# Patient Record
Sex: Female | Born: 1950 | Hispanic: No | Marital: Single | Smoking: Former smoker
Health system: Southern US, Community
[De-identification: ages and names within clinical notes are randomized; demographics above are authoritative.]

## PROBLEM LIST (undated history)

## (undated) DIAGNOSIS — J449 Chronic obstructive pulmonary disease, unspecified: Secondary | ICD-10-CM

## (undated) DIAGNOSIS — S14109A Unspecified injury at unspecified level of cervical spinal cord, initial encounter: Secondary | ICD-10-CM

## (undated) DIAGNOSIS — S069X9A Unspecified intracranial injury with loss of consciousness of unspecified duration, initial encounter: Secondary | ICD-10-CM

## (undated) DIAGNOSIS — S069XAA Unspecified intracranial injury with loss of consciousness status unknown, initial encounter: Secondary | ICD-10-CM

## (undated) DIAGNOSIS — M5126 Other intervertebral disc displacement, lumbar region: Secondary | ICD-10-CM

## (undated) HISTORY — PX: ABDOMINAL EXPLORATION SURGERY: SHX538

## (undated) HISTORY — PX: NECK SURGERY: SHX720

---

## 2015-01-18 ENCOUNTER — Emergency Department (HOSPITAL_COMMUNITY)
Admission: EM | Admit: 2015-01-18 | Discharge: 2015-01-18 | Disposition: A | Discharge status: Home / Self Care | Payer: Medicare PPO | Attending: Emergency Medicine | Admitting: Emergency Medicine

## 2015-01-18 ENCOUNTER — Emergency Department (HOSPITAL_COMMUNITY): Payer: Medicare PPO

## 2015-01-18 ENCOUNTER — Encounter (HOSPITAL_COMMUNITY): Payer: Self-pay

## 2015-01-18 DIAGNOSIS — J449 Chronic obstructive pulmonary disease, unspecified: Secondary | ICD-10-CM | POA: Insufficient documentation

## 2015-01-18 DIAGNOSIS — Z79899 Other long term (current) drug therapy: Secondary | ICD-10-CM | POA: Insufficient documentation

## 2015-01-18 DIAGNOSIS — M5136 Other intervertebral disc degeneration, lumbar region: Secondary | ICD-10-CM | POA: Diagnosis not present

## 2015-01-18 DIAGNOSIS — Z87891 Personal history of nicotine dependence: Secondary | ICD-10-CM | POA: Diagnosis not present

## 2015-01-18 DIAGNOSIS — K458 Other specified abdominal hernia without obstruction or gangrene: Secondary | ICD-10-CM | POA: Diagnosis not present

## 2015-01-18 DIAGNOSIS — M79605 Pain in left leg: Secondary | ICD-10-CM

## 2015-01-18 DIAGNOSIS — M5126 Other intervertebral disc displacement, lumbar region: Secondary | ICD-10-CM

## 2015-01-18 DIAGNOSIS — M545 Low back pain: Secondary | ICD-10-CM

## 2015-01-18 DIAGNOSIS — Z87828 Personal history of other (healed) physical injury and trauma: Secondary | ICD-10-CM | POA: Diagnosis not present

## 2015-01-18 DIAGNOSIS — M549 Dorsalgia, unspecified: Secondary | ICD-10-CM | POA: Diagnosis not present

## 2015-01-18 HISTORY — DX: Unspecified intracranial injury with loss of consciousness of unspecified duration, initial encounter: S06.9X9A

## 2015-01-18 HISTORY — DX: Unspecified intracranial injury with loss of consciousness status unknown, initial encounter: S06.9XAA

## 2015-01-18 HISTORY — DX: Unspecified injury at unspecified level of cervical spinal cord, initial encounter: S14.109A

## 2015-01-18 HISTORY — DX: Chronic obstructive pulmonary disease, unspecified: J44.9

## 2015-01-18 MED ORDER — TIZANIDINE HCL 4 MG PO TABS: 8.0000 mg | ORAL_TABLET | Freq: Three times a day (TID) | ORAL | Status: DC

## 2015-01-18 MED ORDER — PREGABALIN 200 MG PO CAPS: 200.0000 mg | ORAL_CAPSULE | Freq: Two times a day (BID) | ORAL | Status: DC

## 2015-01-18 MED ORDER — OXYCODONE-ACETAMINOPHEN 5-325 MG PO TABS: 1.0000 | ORAL_TABLET | Freq: Four times a day (QID) | ORAL | Status: DC | PRN

## 2015-01-18 MED ORDER — OXYCODONE-ACETAMINOPHEN 5-325 MG PO TABS
2.0000 | ORAL_TABLET | Freq: Once | ORAL | Status: AC
  Administered 2015-01-18: 2 via ORAL
  Filled 2015-01-18: qty 2

## 2015-01-18 MED ORDER — HYDROMORPHONE HCL 1 MG/ML IJ SOLN
1.0000 mg | Freq: Once | INTRAMUSCULAR | Status: AC
  Administered 2015-01-18: 1 mg via INTRAMUSCULAR
  Filled 2015-01-18: qty 1

## 2015-01-18 MED ORDER — DULOXETINE HCL 60 MG PO CPEP: 60.0000 mg | ORAL_CAPSULE | Freq: Every day | ORAL | Status: DC

## 2015-01-18 MED ORDER — TIZANIDINE HCL 4 MG PO TABS: 4.0000 mg | ORAL_TABLET | Freq: Four times a day (QID) | ORAL | Status: AC | PRN

## 2015-01-18 NOTE — ED Notes (Signed)
Patient transported to X-ray 

## 2015-01-18 NOTE — Progress Notes (Addendum)
1541 CM called Dr Zachery Dauer office [phone] and spoke with staff to find out pt appt in December was not with Dr Zachery Dauer but with Dr Barbaraann Barthel.  Office staff able to provide Cm with an earlier appt for 02/11/15 at 11 am with Sigmund Hazel, MD Pt agreed to this appt and will updated [employer] on lisa miller name so it will be in [place] data base prior to her appt.  This appt confirmed with [facility] office staff This appt place in pt d/c instructions and given to pt with a list of in network [place] MDs and urgent care centers in [place] [id number] Pt very appreciative of services rendered  EDP updated RN updated  1446 EDP Cook updated 1443 CM finished speaking with pt who confirms she has [employer] Lawless PPO plan and is scheduled to see Dr Juluis Rainier (because her female friend at the bedside goes to Dr Zachery Dauer) to establish care in December 2016 but is running out of medications  CM recommended pt contact a [employer] dr from a list of [facility] in network doctors to see prior to her December appt with Dr Zachery Dauer or one of the local urgent cares CM explained to the pt the differences in pcp vs EDP services Discussed EDPs get patients out of emergency situations and provide emergency care until the pcp can follow up with pt standard care Pt voiced understanding and states she has memory issues ("this is why I brought Miranda" ) 1352 ED CM spoke with EDP Adriana Simas about pt concerns Reports pt recently moved to [facility] from [facility] and earliest MD appt will be in December 2016 Pt about to run out of her medications

## 2015-01-18 NOTE — ED Notes (Signed)
Pt here with back pain and body aches.  Pt had spinal surgery in ?2000.  Pt had spinal injury during surgery.  Pt also has hx of TBI.  Pt here bc she fell x 3 weeks ago.  Has had similar pain to back and leg as she did before.  Bruising to hip.  Takes home pain meds chronically and her meds are not helping her pain.  Just moved here and cannot get into a new MD until Dec. 6th.

## 2015-01-18 NOTE — Discharge Instructions (Signed)
I have given you prescriptions for 2 weeks. Follow-up with primary care as discussed by Child psychotherapist.   Herniated Disk A herniated disk occurs when a disk in your spine bulges out too far. This condition is also called a ruptured disk or slipped disk. Your spine (backbone) is made up of bones called vertebrae. Between each pair of vertebrae is an oval disk with a soft, spongy center that acts as a shock absorber when you move. The spongy center is surrounded by a tough outer ring. When you have a herniated disk, the spongy center of the disk bulges out or ruptures through the outer ring. A herniated disk can press on a nerve between your vertebrae and cause pain. A herniated disk can occur anywhere in your back or neck area, but the lower back is the most common spot. CAUSES  In many cases, a herniated disk occurs just from getting older. As you age, the spongy insides of your disks tend to shrink and dry out. A herniated disk can result from gradual wear and tear. Injury or sudden strain can also cause a herniated disk.  RISK FACTORS Aging is the main risk factor for a herniated disk. Other risk factors include:  Being a man between the ages of 73 and 56 years.  Having a job that requires heavy lifting, bending, or twisting.  Having a job that requires long hours of driving.  Not getting enough exercise.  Being overweight.  Smoking. SIGNS AND SYMPTOMS  Signs and symptoms depend on which disk is herniated.  For a herniated disk in the lower back, you may have sharp pain in:  One part of your leg, hip, or buttocks.  The back of your calf.  The top or sole of your foot (sciatica).   For a herniated disk in the neck, you may feel pain:  When you move your neck.  Near or over your shoulder blade.  That moves to your upper arm, forearm, or fingers.   You may also have muscle weakness. It may be hard to:  Lift your leg or arm.  Stand on your toes.  Squeeze tightly with one  of your hands.  Other symptoms can include:  Numbness or tingling in the affected areas of your body.  Loss of bladder or bowel control. This is a rare but serious sign of a severe herniated disk in the lower back. DIAGNOSIS  Your health care provider will do a physical exam. During this exam, you may have to move certain body parts or assume various positions. For example, your health care provider may do the straight-leg test. This is a good way to test for a herniated disk in your lower back. In this test, the health care provider lifts your leg while you lie on your back. This is to see if you feel pain down your leg. Your health care provider will also check for numbness or loss of feeling.  Your health care provider will also check your:  Reflexes.  Muscle strength.  Posture.  Other tests may be done to help in making a diagnosis. These may include:  An X-ray of the spine to rule out other causes of back pain.   Other imaging studies, such as an MRI or CT scan. This is to check whether the herniated disk is pressing on your spinal canal.  Electromyography (EMG). This test checks the nerves that control muscles. It is sometimes used to identify the specific area of nerve involvement.  TREATMENT  In many cases, herniated disk symptoms go away over a period of days or weeks. You will most likely be free of symptoms in 3-4 months. Treatment may include the following:  The initial treatment for a herniated disk is ashort period of rest.  Bed rest is often limited to 1 or 2 days. Resting for too long delays recovery.  If you have a herniated disk in your lower back, you should avoid sitting as much as possible because sitting increases pressure on the disk.  Medicines. These may include:   Nonsteroidal anti-inflammatory drugs (NSAIDs).  Muscle relaxants for back spasms.  Narcotic pain medicine if your pain is very bad.   Steroid injections. You may need these along the  involved nerve root to help control pain. The steroid is injected in the area of the herniated disk. It helps by reducing swelling around the disk.  Physical therapy. This may include exercises to strengthen the muscles that help support your spine.   You may need surgery if other treatments do not work.  HOME CARE INSTRUCTIONS Follow all your health care provider's instructions. These may include:  Take all medicines as directed by your health care provider.  Rest for 2 days and then start moving.  Do not sit or stand for long periods of time.  Maintain good posture when sitting and standing.  Avoid movements that cause pain, such as bending or lifting.  When you are able to start lifting things again:  [place] with your knees.  Keep your back straight.  Hold heavy objects close to your body.  If you are overweight, ask your health care provider to help you start a weight-loss program.  When you are able to start exercising, ask your health care provider how much and what type of exercise is best for you.  Work with a physical therapist on stretching and strengthening exercises for your back.  Do not wear high-heeled shoes.  Do not sleep on your belly.  Do not smoke.  Keep all follow-up visits as directed by your health care provider. SEEK MEDICAL CARE IF:  You have back or neck pain that is not getting better after 4 weeks.  You have very bad pain in your back or neck.  You develop numbness, tingling, or weakness along with pain. SEEK IMMEDIATE MEDICAL CARE IF:   You have numbness, tingling, or weakness that makes you unable to use your arms or legs.  You lose control of your bladder or bowels.  You have dizziness or fainting.  You have shortness of breath.  MAKE SURE YOU:   Understand these instructions.  Will watch your condition.  Will get help right away if you are not doing well or get worse. Document Released: 04/21/2000 Document Revised:  09/08/2013 Document Reviewed: 03/28/2013 [facility] Patient Information 2015 [place], [place]. This information is not intended to replace advice given to you by your health care provider. Make sure you discuss any questions you have with your health care provider.

## 2015-01-18 NOTE — ED Notes (Signed)
MD at bedside. 

## 2015-01-18 NOTE — ED Provider Notes (Addendum)
CSN: [id number]     Arrival date & time 01/18/15  1121 History   First MD Initiated Contact with Patient 01/18/15 1146     Chief Complaint  Patient presents with  . Back Pain  . Generalized Body Aches     (Consider location/radiation/quality/duration/timing/severity/associated sxs/prior Treatment) HPI.....Marland Kitchen low back pain with radiation to left leg for 2-3 months. Patient recently moved here from [place] does not have a primary care appointment until December. She is status post an anterior fusion of C4,5,6 in [place] several years ago. Additionally she has run out of her medication which includes Tizanidine, Lyrica, Duloxetine.  No bowel or bladder incontinence. No fever, sweats, chills.  Past Medical History  Diagnosis Date  . TBI (traumatic brain injury)   . Spinal cord injury, cervical region   . COPD (chronic obstructive pulmonary disease)    Past Surgical History  Procedure Laterality Date  . Neck surgery    . Abdominal exploration surgery     No family history on file. Social History  Substance Use Topics  . Smoking status: Former Games developer  . Smokeless tobacco: Not on file  . Alcohol Use: No   OB History    No data available     Review of Systems  All other systems reviewed and are negative.     Allergies  Sulfa antibiotics and Other  Home Medications   Prior to Admission medications   Medication Sig Start Date End Date Taking? Authorizing Provider  acetaminophen (TYLENOL) 500 MG tablet Take 2,000 mg by mouth 2 (two) times daily.   Yes Historical Provider, MD  albuterol (PROVENTIL HFA;VENTOLIN HFA) 108 (90 BASE) MCG/ACT inhaler Inhale 1-2 puffs into the lungs every 6 (six) hours as needed for wheezing or shortness of breath.   Yes Historical Provider, MD  Aluminum Acetate (ACID MANTLE EX) Apply 1 application topically 6 (six) times daily. Adds hydrocortisone to it.   Yes Historical Provider, MD  Cholecalciferol (VITAMIN D PO) Take 1 tablet by mouth daily.   Yes  Historical Provider, MD  cyclobenzaprine (FLEXERIL) 10 MG tablet Take 20 mg by mouth 3 (three) times daily as needed for muscle spasms.   Yes Historical Provider, MD  diphenhydramine-acetaminophen (TYLENOL PM) 25-500 MG TABS Take 3-4 tablets by mouth at bedtime as needed (pain).   Yes Historical Provider, MD  DULoxetine (CYMBALTA) 60 MG capsule Take 120 mg by mouth daily.   Yes Historical Provider, MD  guaiFENesin (MUCINEX) 600 MG 12 hr tablet Take 1 mg by mouth 2 (two) times daily.   Yes Historical Provider, MD  lanolin ointment Apply 1 application topically 2 (two) times daily.   Yes Historical Provider, MD  Multiple Vitamin (MULTIVITAMIN WITH MINERALS) TABS tablet Take 1 tablet by mouth daily.   Yes Historical Provider, MD  naproxen sodium (ANAPROX) 220 MG tablet Take 220 mg by mouth 2 (two) times daily.   Yes Historical Provider, MD  POTASSIUM PO Take 1 tablet by mouth daily.   Yes Historical Provider, MD  pregabalin (LYRICA) 200 MG capsule Take 200-400 mg by mouth 2 (two) times daily. Takes one capsule in the morning  ( 200 mg) and two capsules at night (400 mg)   Yes Historical Provider, MD  Pyridoxine HCl (VITAMIN B-6 PO) Take 1 tablet by mouth daily.   Yes Historical Provider, MD  tiZANidine (ZANAFLEX) 4 MG tablet Take 8 mg by mouth every 6 (six) hours as needed for muscle spasms.   Yes Historical Provider, MD   BP 137/79  mmHg  Pulse 97  Temp(Src) 98.2 F (36.8 C) (Oral)  Resp 16  SpO2 97% Physical Exam  Constitutional: She is oriented to person, place, and time. She appears well-developed and well-nourished.  HENT:  Head: Normocephalic and atraumatic.  Eyes: Conjunctivae and EOM are normal. Pupils are equal, round, and reactive to light.  Neck: Normal range of motion. Neck supple.  Cardiovascular: Normal rate and regular rhythm.   Pulmonary/Chest: Effort normal and breath sounds normal.  Abdominal: Soft. Bowel sounds are normal.  Musculoskeletal:  Minimal tenderness lower spine   Neurological: She is alert and oriented to person, place, and time.  Skin: Skin is warm and dry.  Psychiatric: She has a normal mood and affect. Her behavior is normal.  Nursing note and vitals reviewed.   ED Course  Procedures (including critical care time) Labs Review Labs Reviewed - No data to display  Imaging Review Dg Lumbar Spine Complete  01/18/2015   CLINICAL DATA:  Status post fall 2 months ago with low back pain.  EXAM: LUMBAR SPINE - COMPLETE 4+ VIEW  COMPARISON:  None.  FINDINGS: There is no evidence of lumbar spine fracture. There is scoliosis of spine. There mild decreased intervertebral space throughout lumbar spine. Facet joint sclerosis is identified throughout lumbar spine.  IMPRESSION: No acute fracture or dislocation. Degenerative joint changes throughout lumbar spine.   Electronically Signed   By: Sherian Rein M.D.   On: 01/18/2015 13:48   I have personally reviewed and evaluated these images and lab results as part of my medical decision-making.   EKG Interpretation None      MDM   Final diagnoses:  Low back pain radiating to left leg    MRI of lumbar spine pending. Social work consult obtained. Will prescribe her medication for 2 weeks. Discussed with Dr. Ilean China, MD 01/18/15 1541  Donnetta Hutching, MD 01/18/15 1623

## 2015-01-25 ENCOUNTER — Emergency Department (HOSPITAL_COMMUNITY)
Admission: EM | Admit: 2015-01-25 | Discharge: 2015-01-25 | Disposition: A | Discharge status: Home / Self Care | Payer: Medicare PPO | Attending: Emergency Medicine | Admitting: Emergency Medicine

## 2015-01-25 ENCOUNTER — Encounter (HOSPITAL_COMMUNITY): Payer: Self-pay | Admitting: *Deleted

## 2015-01-25 DIAGNOSIS — J449 Chronic obstructive pulmonary disease, unspecified: Secondary | ICD-10-CM | POA: Insufficient documentation

## 2015-01-25 DIAGNOSIS — M5442 Lumbago with sciatica, left side: Secondary | ICD-10-CM | POA: Insufficient documentation

## 2015-01-25 DIAGNOSIS — M545 Low back pain: Secondary | ICD-10-CM | POA: Diagnosis present

## 2015-01-25 DIAGNOSIS — Z87820 Personal history of traumatic brain injury: Secondary | ICD-10-CM | POA: Insufficient documentation

## 2015-01-25 DIAGNOSIS — Z79899 Other long term (current) drug therapy: Secondary | ICD-10-CM | POA: Diagnosis not present

## 2015-01-25 DIAGNOSIS — R200 Anesthesia of skin: Secondary | ICD-10-CM | POA: Insufficient documentation

## 2015-01-25 DIAGNOSIS — Z791 Long term (current) use of non-steroidal anti-inflammatories (NSAID): Secondary | ICD-10-CM | POA: Diagnosis not present

## 2015-01-25 DIAGNOSIS — Z87891 Personal history of nicotine dependence: Secondary | ICD-10-CM | POA: Diagnosis not present

## 2015-01-25 HISTORY — DX: Other intervertebral disc displacement, lumbar region: M51.26

## 2015-01-25 MED ORDER — TRAMADOL HCL 50 MG PO TABS: 50.0000 mg | ORAL_TABLET | Freq: Four times a day (QID) | ORAL | Status: DC | PRN

## 2015-01-25 MED ORDER — NAPROXEN 250 MG PO TABS: 250.0000 mg | ORAL_TABLET | Freq: Two times a day (BID) | ORAL | Status: DC

## 2015-01-25 NOTE — Discharge Instructions (Signed)
Back Exercises °Back exercises help treat and prevent back injuries. The goal of back exercises is to increase the strength of your abdominal and back muscles and the flexibility of your back. These exercises should be started when you no longer have back pain. Back exercises include: °· Pelvic Tilt. Lie on your back with your knees bent. Tilt your pelvis until the lower part of your back is against the floor. Hold this position 5 to 10 sec and repeat 5 to 10 times. °· Knee to Chest. Pull first 1 knee up against your chest and hold for 20 to 30 seconds, repeat this with the other knee, and then both knees. This may be done with the other leg straight or bent, whichever feels better. °· Sit-Ups or Curl-Ups. Bend your knees 90 degrees. Start with tilting your pelvis, and do a partial, slow sit-up, lifting your trunk only 30 to 45 degrees off the floor. Take at least 2 to 3 seconds for each sit-up. Do not do sit-ups with your knees out straight. If partial sit-ups are difficult, simply do the above but with only tightening your abdominal muscles and holding it as directed. °· Hip-Lift. Lie on your back with your knees flexed 90 degrees. Push down with your feet and shoulders as you raise your hips a couple inches off the floor; hold for 10 seconds, repeat 5 to 10 times. °· Back arches. Lie on your stomach, propping yourself up on bent elbows. Slowly press on your hands, causing an arch in your low back. Repeat 3 to 5 times. Any initial stiffness and discomfort should lessen with repetition over time. °· Shoulder-Lifts. Lie face down with arms beside your body. Keep hips and torso pressed to floor as you slowly lift your head and shoulders off the floor. °Do not overdo your exercises, especially in the beginning. Exercises may cause you some mild back discomfort which lasts for a few minutes; however, if the pain is more severe, or lasts for more than 15 minutes, do not continue exercises until you see your caregiver.  Improvement with exercise therapy for back problems is slow.  °See your caregivers for assistance with developing a proper back exercise program. °Document Released: 06/01/2004 Document Revised: 07/17/2011 Document Reviewed: 02/23/2011 °ExitCare® Patient Information ©2015 [employer]. This information is not intended to replace advice given to you by your health care provider. Make sure you discuss any questions you have with your health care provider. °Back Pain, Adult °Low back pain is very common. About 1 in 5 people have back pain. The cause of low back pain is rarely dangerous. The pain often gets better over time. About half of people with a sudden onset of back pain feel better in just 2 weeks. About 8 in 10 people feel better by 6 weeks.  °CAUSES °Some common causes of back pain include: °· Strain of the muscles or ligaments supporting the spine. °· Wear and tear (degeneration) of the spinal discs. °· Arthritis. °· Direct injury to the back. °DIAGNOSIS °Most of the time, the direct cause of low back pain is not known. However, back pain can be treated effectively even when the exact cause of the pain is unknown. Answering your caregiver's questions about your overall health and symptoms is one of the most accurate ways to make sure the cause of your pain is not dangerous. If your caregiver needs more information, he or she may order lab work or imaging tests (X-rays or MRIs). However, even if imaging tests show changes in your back,   this usually does not require surgery. °HOME CARE INSTRUCTIONS °For many people, back pain returns. Since low back pain is rarely dangerous, it is often a condition that people can learn to manage on their own.  °· Remain active. It is stressful on the back to sit or stand in one place. Do not sit, drive, or stand in one place for more than 30 minutes at a time. Take short walks on level surfaces as soon as pain allows. Try to increase the length of time you walk each  day. °· Do not stay in bed. Resting more than 1 or 2 days can delay your recovery. °· Do not avoid exercise or work. Your body is made to move. It is not dangerous to be active, even though your back may hurt. Your back will likely heal faster if you return to being active before your pain is gone. °· Pay attention to your body when you  bend and lift. Many people have less discomfort when lifting if they bend their knees, keep the load close to their bodies, and avoid twisting. Often, the most comfortable positions are those that put less stress on your recovering back. °· Find a comfortable position to sleep. Use a firm mattress and lie on your side with your knees slightly bent. If you lie on your back, put a pillow under your knees. °· Only take over-the-counter or prescription medicines as directed by your caregiver. Over-the-counter medicines to reduce pain and inflammation are often the most helpful. Your caregiver may prescribe muscle relaxant drugs. These medicines help dull your pain so you can more quickly return to your normal activities and healthy exercise. °· Put ice on the injured area. °· Put ice in a plastic bag. °· Place a towel between your skin and the bag. °· Leave the ice on for 15-20 minutes, 03-04 times a day for the first 2 to 3 days. After that, ice and heat may be alternated to reduce pain and spasms. °· Ask your caregiver about trying back exercises and gentle massage. This may be of some benefit. °· Avoid feeling anxious or stressed. Stress increases muscle tension and can worsen back pain. It is important to recognize when you are anxious or stressed and learn ways to manage it. Exercise is a great option. °SEEK MEDICAL CARE IF: °· You have pain that is not relieved with rest or medicine. °· You have pain that does not improve in 1 week. °· You have new symptoms. °· You are generally not feeling well. °SEEK IMMEDIATE MEDICAL CARE IF:  °· You have pain that radiates from your back into  your legs. °· You develop new bowel or bladder control problems. °· You have unusual weakness or numbness in your arms or legs. °· You develop nausea or vomiting. °· You develop abdominal pain. °· You feel faint. °Document Released: 04/24/2005 Document Revised: 10/24/2011 Document Reviewed: 08/26/2013 °ExitCare® Patient Information ©2015 [employer]. This information is not intended to replace advice given to you by your health care provider. Make sure you discuss any questions you have with your health care provider. °Sciatica °Sciatica is pain, weakness, numbness, or tingling along the path of the sciatic nerve. The nerve starts in the lower back and runs down the back of each leg. The nerve controls the muscles in the lower leg and in the back of the knee, while also providing sensation to the back of the thigh, lower leg, and the sole of your foot. Sciatica is a symptom of another medical condition. For instance, nerve damage or certain conditions, such as a   herniated disk or bone spur on the spine, pinch or put pressure on the sciatic nerve. This causes the pain, weakness, or other sensations normally associated with sciatica. Generally, sciatica only affects one side of the body. °CAUSES  °· Herniated or slipped disc. °· Degenerative disk disease. °· A pain disorder involving the narrow muscle in the buttocks (piriformis syndrome). °· Pelvic injury or fracture. °· Pregnancy. °· Tumor (rare). °SYMPTOMS  °Symptoms can vary from mild to very severe. The symptoms usually travel from the low back to the buttocks and down the back of the leg. Symptoms can include: °· Mild tingling or dull aches in the lower back, leg, or hip. °· Numbness in the back of the calf or sole of the foot. °· Burning sensations in the lower back, leg, or hip. °· Sharp pains in the lower back, leg, or hip. °· Leg weakness. °· Severe back pain inhibiting movement. °These symptoms may get worse with coughing, sneezing, laughing, or prolonged  sitting or standing. Also, being overweight may worsen symptoms. °DIAGNOSIS  °Your caregiver will perform a physical exam to look for common symptoms of sciatica. He or she may ask you to do certain movements or activities that would trigger sciatic nerve pain. Other tests may be performed to find the cause of the sciatica. These may include: °· Blood tests. °· X-rays. °· Imaging tests, such as an MRI or CT scan. °TREATMENT  °Treatment is directed at the cause of the sciatic pain. Sometimes, treatment is not necessary and the pain and discomfort goes away on its own. If treatment is needed, your caregiver may suggest: °· Over-the-counter medicines to relieve pain. °· Prescription medicines, such as anti-inflammatory medicine, muscle relaxants, or narcotics. °· Applying heat or ice to the painful area. °· Steroid injections to lessen pain, irritation, and inflammation around the nerve. °· Reducing activity during periods of pain. °· Exercising and stretching to strengthen your abdomen and improve flexibility of your spine. Your caregiver may suggest losing weight if the extra weight makes the back pain worse. °· Physical therapy. °· Surgery to eliminate what is pressing or pinching the nerve, such as a bone spur or part of a herniated disk. °HOME CARE INSTRUCTIONS  °· Only take over-the-counter or prescription medicines for pain or discomfort as directed by your caregiver. °· Apply ice to the affected area for 20 minutes, 3-4 times a day for the first 48-72 hours. Then try heat in the same way. °· Exercise, stretch, or perform your usual activities if these do not aggravate your pain. °· Attend physical therapy sessions as directed by your caregiver. °· Keep all follow-up appointments as directed by your caregiver. °· Do not wear high heels or shoes that do not provide proper support. °· Check your mattress to see if it is too soft. A firm mattress may lessen your pain and discomfort. °SEEK IMMEDIATE MEDICAL CARE IF:   °· You lose control of your bowel or bladder (incontinence). °· You have increasing weakness in the lower back, pelvis, buttocks, or legs. °· You have redness or swelling of your back. °· You have a burning sensation when you urinate. °· You have pain that gets worse when you lie down or awakens you at night. °· Your pain is worse than you have experienced in the past. °· Your pain is lasting longer than 4 weeks. °· You are suddenly losing weight without reason. °MAKE SURE YOU: °· Understand these instructions. °· Will watch your condition. °· Will get help   right away if you are not doing well or get worse. °Document Released: 04/18/2001 Document Revised: 10/24/2011 Document Reviewed: 09/03/2011 °ExitCare® Patient Information ©2015 [employer]. This information is not intended to replace advice given to you by your health care provider. Make sure you discuss any questions you have with your health care provider. ° °

## 2015-01-25 NOTE — ED Notes (Signed)
Pt verbalized understanding of discharge instructions.

## 2015-01-25 NOTE — ED Notes (Signed)
Pt reports chronic back pain.  Was seen here for same and was only given 5 days worth of pain meds.  Pt reports hx of herniated disc.  Pt reports pain is radiating down to her L leg.  Pending appt with her PCP on the 6th of October.

## 2015-01-25 NOTE — ED Provider Notes (Signed)
CSN: [id number]     Arrival date & time 01/25/15  1551 History  This chart was scribed for Everlene Farrier, PA-C, working with Nelva Nay, MD by Elon Spanner, ED Scribe. This patient was seen in room WTR9/WTR9 and the patient's care was started at 7:22 PM.   Chief Complaint  Patient presents with  . Back Pain   The history is provided by the patient. No language interpreter was used.   HPI Comments: Michelle Bond is a 64 y.o. female who presents to the Emergency Department complaining of 10/10 low back pain radiating down the left leg onset several months ago.  Patient was seen in ED on 9/12 for same.  She denies new injury/fall and reports the pain is unchanged with the exception of new numbness in the left foot that was previously characterized only as tingling.  This change was first observed after she ran out of her 20 percocet within the past several days which was prescribed at her last ED visit.  She reports being new to the area and has an appt to establish PCP care on 10/6.  Patient reports a single, new episode of bowel incontinence two days ago: "I rushed to the bathroom but could not hold it in".  She reports this type of bowel urgency is typical, but she has never previously lost control on herself.  She reports she has been taking all of her other medications including Lyrica. She denies fever, abdominal pain, loss of bladder control, difficulty urinating, urinary symptoms, rashes, fevers, or history of cancer.  Past Medical History  Diagnosis Date  . TBI (traumatic brain injury)   . Spinal cord injury, cervical region   . COPD (chronic obstructive pulmonary disease)   . Lumbar herniated disc    Past Surgical History  Procedure Laterality Date  . Neck surgery    . Abdominal exploration surgery     No family history on file. Social History  Substance Use Topics  . Smoking status: Former Games developer  . Smokeless tobacco: None  . Alcohol Use: No   OB History    No data  available     Review of Systems  Constitutional: Negative for fever.  Gastrointestinal: Negative for nausea, vomiting and abdominal pain.  Genitourinary: Negative for dysuria, frequency, hematuria and difficulty urinating.  Musculoskeletal: Positive for back pain.  Neurological: Positive for numbness. Negative for syncope and weakness.      Allergies  Sulfa antibiotics and Other  Home Medications   Prior to Admission medications   Medication Sig Start Date End Date Taking? Authorizing Provider  acetaminophen (TYLENOL) 500 MG tablet Take 2,000 mg by mouth 2 (two) times daily.    Historical Provider, MD  albuterol (PROVENTIL HFA;VENTOLIN HFA) 108 (90 BASE) MCG/ACT inhaler Inhale 1-2 puffs into the lungs every 6 (six) hours as needed for wheezing or shortness of breath.    Historical Provider, MD  Aluminum Acetate (ACID MANTLE EX) Apply 1 application topically 6 (six) times daily. Adds hydrocortisone to it.    Historical Provider, MD  Cholecalciferol (VITAMIN D PO) Take 1 tablet by mouth daily.    Historical Provider, MD  cyclobenzaprine (FLEXERIL) 10 MG tablet Take 20 mg by mouth 3 (three) times daily as needed for muscle spasms.    Historical Provider, MD  diphenhydramine-acetaminophen (TYLENOL PM) 25-500 MG TABS Take 3-4 tablets by mouth at bedtime as needed (pain).    Historical Provider, MD  DULoxetine (CYMBALTA) 60 MG capsule Take 1 capsule (60 mg total) by  mouth daily. Two tabs po qd 01/18/15   Donnetta Hutching, MD  guaiFENesin (MUCINEX) 600 MG 12 hr tablet Take 1 mg by mouth 2 (two) times daily.    Historical Provider, MD  lanolin ointment Apply 1 application topically 2 (two) times daily.    Historical Provider, MD  Multiple Vitamin (MULTIVITAMIN WITH MINERALS) TABS tablet Take 1 tablet by mouth daily.    Historical Provider, MD  naproxen (NAPROSYN) 250 MG tablet Take 1 tablet (250 mg total) by mouth 2 (two) times daily with a meal. 01/25/15   Everlene Farrier, PA-C  naproxen sodium  (ANAPROX) 220 MG tablet Take 220 mg by mouth 2 (two) times daily.    Historical Provider, MD  oxyCODONE-acetaminophen (PERCOCET/ROXICET) 5-325 MG per tablet Take 1-2 tablets by mouth every 6 (six) hours as needed. 01/18/15   Donnetta Hutching, MD  POTASSIUM PO Take 1 tablet by mouth daily.    Historical Provider, MD  pregabalin (LYRICA) 200 MG capsule Take 1 capsule (200 mg total) by mouth 2 (two) times daily. One tab po qam;  Two tabs po qhs 01/18/15   Donnetta Hutching, MD  Pyridoxine HCl (VITAMIN B-6 PO) Take 1 tablet by mouth daily.    Historical Provider, MD  tiZANidine (ZANAFLEX) 4 MG tablet Take 1 tablet (4 mg total) by mouth every 6 (six) hours as needed for muscle spasms. Two tabs po tid 01/18/15   Donnetta Hutching, MD  traMADol (ULTRAM) 50 MG tablet Take 1 tablet (50 mg total) by mouth every 6 (six) hours as needed for moderate pain or severe pain. 01/25/15   Everlene Farrier, PA-C   BP 178/85 mmHg  Pulse 103  Temp(Src) 98 F (36.7 C) (Oral)  Resp 18  SpO2 100% Physical Exam  Constitutional: She is oriented to person, place, and time. She appears well-developed and well-nourished. No distress.  Nontoxic appearing.  HENT:  Head: Normocephalic and atraumatic.  Eyes: Right eye exhibits no discharge. Left eye exhibits no discharge.  Cardiovascular: Normal rate, regular rhythm, normal heart sounds and intact distal pulses.   Bilateral radial, posterior tibialis and dorsalis pedis pulses are intact.    Pulmonary/Chest: Effort normal. No respiratory distress.  Abdominal: Soft. There is no tenderness.  Genitourinary:  Anal sphincter tone normal.    Musculoskeletal: She exhibits tenderness. She exhibits no edema.  Tenderness of the patients left low back. No back edema, deformity, ecchymosis or erythema. Patient able ambulate with normal gait.   Bilateral patellar DTR's intact.    Neurological: She is alert and oriented to person, place, and time. She has normal reflexes. She displays normal reflexes.  Coordination normal.  Bilateral patellar DTRs are intact. She reports sensation is intact in portions of her left foot but not in others. This is in a nonspecific distribution. Anal sphincter tone is normal. She is able to ambulate with normal gait. She has 5 out of 5 strength in her bilateral upper and lower extremities.  Skin: Skin is warm and dry. No rash noted. She is not diaphoretic. No erythema. No pallor.  Psychiatric: She has a normal mood and affect. Her behavior is normal.  Nursing note and vitals reviewed.   ED Course  Procedures (including critical care time)  DIAGNOSTIC STUDIES: Oxygen Saturation is 100% on RA, normal by my interpretation.    COORDINATION OF CARE:  7:37 PM Will consult with attending.    Labs Review Labs Reviewed - No data to display  Imaging Review No results found.    EKG Interpretation  None      Filed Vitals:   01/25/15 1619  BP: 178/85  Pulse: 103  Temp: 98 F (36.7 C)  TempSrc: Oral  Resp: 18  SpO2: 100%     MDM   Meds given in ED:  Medications - No data to display  New Prescriptions   NAPROXEN (NAPROSYN) 250 MG TABLET    Take 1 tablet (250 mg total) by mouth 2 (two) times daily with a meal.   TRAMADOL (ULTRAM) 50 MG TABLET    Take 1 tablet (50 mg total) by mouth every 6 (six) hours as needed for moderate pain or severe pain.    Final diagnoses:  Left-sided low back pain with left-sided sciatica   This is a 65 year old female with chronic low back pain with sciatica. This patient was seen in the emergency department on 01/18/2015 and had plain films and lumbar spine MRI. These were reviewed by me as well. Patient reports that she's run out of her Percocet since she was last here. She reports she has more numbness in her right foot been previously. She also reports she had one episode of bowel incontinence 2 days ago. She reports she's had normal bowel movements since then. On exam the patient is afebrile nontoxic appearing. Patient  reports decreased sensation in portions of her foot and nonspecific distribution. Bilateral patellar DTRs are intact. She is able to ambulate with normal gait. Anal sphincter tone is normal. Patient reports she's been taking Lyrica and other medicines but reports that she is only out of her Percocet. The patient has not taking an NSAID. We'll start the patient on an NSAID and provide with additional small amount of tramadol. I advised her she needs to follow-up with primary care and keep her appointment on October 6. I advised her that I would be unable to provide her with more narcotic pain medicines that she return but she is welcome to return at anytime with new or worsening symptoms or new concerns. I advised the patient to follow-up with their primary care provider this week. I advised the patient to return to the emergency department with new or worsening symptoms or new concerns. The patient verbalized understanding and agreement with plan.     This patient was discussed with Dr. Radford Pax who agrees with assessment and plan.  I personally performed the services described in this documentation, which was scribed in my presence. The recorded information has been reviewed and is accurate.     Everlene Farrier, PA-C 01/25/15 2008  Nelva Nay, MD 01/25/15 [id number]

## 2015-02-11 DIAGNOSIS — M5136 Other intervertebral disc degeneration, lumbar region: Secondary | ICD-10-CM | POA: Diagnosis not present

## 2015-02-22 DIAGNOSIS — M5126 Other intervertebral disc displacement, lumbar region: Secondary | ICD-10-CM | POA: Diagnosis not present

## 2015-03-16 DIAGNOSIS — M5136 Other intervertebral disc degeneration, lumbar region: Secondary | ICD-10-CM | POA: Diagnosis not present

## 2015-03-16 DIAGNOSIS — E7800 Pure hypercholesterolemia, unspecified: Secondary | ICD-10-CM | POA: Diagnosis not present

## 2015-03-16 DIAGNOSIS — Z23 Encounter for immunization: Secondary | ICD-10-CM | POA: Diagnosis not present

## 2015-06-16 DIAGNOSIS — M5136 Other intervertebral disc degeneration, lumbar region: Secondary | ICD-10-CM | POA: Diagnosis not present

## 2015-06-16 DIAGNOSIS — R05 Cough: Secondary | ICD-10-CM | POA: Diagnosis not present

## 2015-06-16 DIAGNOSIS — E7800 Pure hypercholesterolemia, unspecified: Secondary | ICD-10-CM | POA: Diagnosis not present

## 2015-07-13 DIAGNOSIS — Z1211 Encounter for screening for malignant neoplasm of colon: Secondary | ICD-10-CM | POA: Diagnosis not present

## 2015-07-13 DIAGNOSIS — M5136 Other intervertebral disc degeneration, lumbar region: Secondary | ICD-10-CM | POA: Diagnosis not present

## 2015-07-13 DIAGNOSIS — R05 Cough: Secondary | ICD-10-CM | POA: Diagnosis not present

## 2015-07-14 ENCOUNTER — Other Ambulatory Visit (HOSPITAL_COMMUNITY): Payer: Self-pay | Admitting: Respiratory Therapy

## 2015-07-14 DIAGNOSIS — R05 Cough: Secondary | ICD-10-CM

## 2015-07-14 DIAGNOSIS — R059 Cough, unspecified: Secondary | ICD-10-CM

## 2015-08-10 ENCOUNTER — Encounter (HOSPITAL_COMMUNITY): Payer: Medicare PPO

## 2015-10-14 DIAGNOSIS — M5136 Other intervertebral disc degeneration, lumbar region: Secondary | ICD-10-CM | POA: Diagnosis not present

## 2015-10-14 DIAGNOSIS — Z79899 Other long term (current) drug therapy: Secondary | ICD-10-CM | POA: Diagnosis not present

## 2015-10-14 DIAGNOSIS — R000 Tachycardia, unspecified: Secondary | ICD-10-CM | POA: Diagnosis not present

## 2016-06-01 DIAGNOSIS — M542 Cervicalgia: Secondary | ICD-10-CM | POA: Diagnosis not present

## 2016-06-01 DIAGNOSIS — R05 Cough: Secondary | ICD-10-CM | POA: Diagnosis not present

## 2016-06-01 DIAGNOSIS — K219 Gastro-esophageal reflux disease without esophagitis: Secondary | ICD-10-CM | POA: Diagnosis not present

## 2016-06-27 ENCOUNTER — Institutional Professional Consult (permissible substitution): Payer: Medicare PPO | Admitting: Internal Medicine

## 2016-07-31 ENCOUNTER — Ambulatory Visit (INDEPENDENT_AMBULATORY_CARE_PROVIDER_SITE_OTHER)
Admission: RE | Admit: 2016-07-31 | Discharge: 2016-07-31 | Disposition: A | Discharge status: Home / Self Care | Payer: Medicare PPO | Source: Ambulatory Visit | Attending: Internal Medicine | Admitting: Internal Medicine

## 2016-07-31 ENCOUNTER — Encounter: Payer: Self-pay | Admitting: Internal Medicine

## 2016-07-31 ENCOUNTER — Other Ambulatory Visit (INDEPENDENT_AMBULATORY_CARE_PROVIDER_SITE_OTHER): Payer: Medicare PPO

## 2016-07-31 ENCOUNTER — Ambulatory Visit (INDEPENDENT_AMBULATORY_CARE_PROVIDER_SITE_OTHER): Payer: Medicare PPO | Admitting: Internal Medicine

## 2016-07-31 VITALS — BP 140/80 | HR 95 | Ht 65.0 in | Wt 140.0 lb

## 2016-07-31 DIAGNOSIS — R053 Chronic cough: Secondary | ICD-10-CM

## 2016-07-31 DIAGNOSIS — R05 Cough: Secondary | ICD-10-CM | POA: Insufficient documentation

## 2016-07-31 DIAGNOSIS — R0602 Shortness of breath: Secondary | ICD-10-CM | POA: Diagnosis not present

## 2016-07-31 DIAGNOSIS — R079 Chest pain, unspecified: Secondary | ICD-10-CM | POA: Diagnosis not present

## 2016-07-31 LAB — CBC WITH DIFFERENTIAL/PLATELET
BASOS PCT: 1.4 % (ref 0.0–3.0)
Basophils Absolute: 0.1 10*3/uL (ref 0.0–0.1)
Eosinophils Absolute: 0.1 10*3/uL (ref 0.0–0.7)
Eosinophils Relative: 1.7 % (ref 0.0–5.0)
HCT: 35.9 % — ABNORMAL LOW (ref 36.0–46.0)
Hemoglobin: 11.8 g/dL — ABNORMAL LOW (ref 12.0–15.0)
LYMPHS PCT: 48.7 % — AB (ref 12.0–46.0)
Lymphs Abs: 3.3 10*3/uL (ref 0.7–4.0)
MCHC: 32.8 g/dL (ref 30.0–36.0)
MCV: 81.5 fl (ref 78.0–100.0)
MONOS PCT: 5.2 % (ref 3.0–12.0)
Monocytes Absolute: 0.4 10*3/uL (ref 0.1–1.0)
NEUTROS ABS: 2.9 10*3/uL (ref 1.4–7.7)
Neutrophils Relative %: 43 % (ref 43.0–77.0)
PLATELETS: 377 10*3/uL (ref 150.0–400.0)
RBC: 4.41 Mil/uL (ref 3.87–5.11)
RDW: 14.1 % (ref 11.5–15.5)
WBC: 6.9 10*3/uL (ref 4.0–10.5)

## 2016-07-31 MED ORDER — FAMOTIDINE 20 MG PO TABS: ORAL_TABLET | ORAL | Status: AC

## 2016-07-31 MED ORDER — PANTOPRAZOLE SODIUM 40 MG PO TBEC: 40.0000 mg | DELAYED_RELEASE_TABLET | Freq: Every day | ORAL | 2 refills | Status: DC

## 2016-07-31 MED ORDER — PREDNISONE 10 MG PO TABS: ORAL_TABLET | ORAL | 0 refills | Status: AC

## 2016-07-31 NOTE — Progress Notes (Signed)
Subjective:     Patient ID: Michelle Bond, female   DOB: 03-20-1951,    MRN: [id number]  HPI  [id number] yowf originally from [place] quit smoking in 1982 with variable rhinitis since 1993 while in [place]  [place]oc with intermittent worse since moving to [place] around 2016 assoc with cough since moving into her present appt where black mold was found and referred to pulmonary clinic 07/31/2016 by Dr Via       07/31/2016 1st [facility] office visit/ Milbert Bixler   Chief Complaint  Patient presents with  . Pulmonary Consult    Referred by Dr. Caryn BeeKevin Via. Pt c/o cough for the past 6 yrs, worse since moving to [place] from [place] 2 [place]2 years ago.  Her cough is prod with dark green sputum "comes and goes".    traveled to [place] no better cough, never rx with abx since in [place] but always helps some - last zpak 06/01/16  Worse cough  at hs immediately despite rolling side  Omeprazole 20 mg ac daily Has assoc urinary incont, h/o purulent sputum but none at present or even recently    No obvious day to day or daytime variability or assoc sob excess/ purulent sputum or mucus plugs or hemoptysis or cp or chest tightness, subjective wheeze or overt sinus or hb symptoms. No unusual exp hx or h/o childhood pna/ asthma or knowledge of premature birth.  Sleeping ok without nocturnal  or early am exacerbation  of respiratory  c/o's or need for noct saba. Also denies any obvious fluctuation of symptoms with weather or environmental changes or other aggravating or alleviating factors except as outlined above   Current Medications, Allergies, Complete Past Medical History, Past Surgical History, Family History, and Social History were reviewed in [facility] electronic medical record.  ROS  The following are not active complaints unless bolded sore throat, dysphagia, dental problems, itching, sneezing,  nasal congestion or excess/ purulent secretions, ear ache,   fever, chills, sweats, unintended wt loss, classically  pleuritic or exertional cp,  orthopnea pnd or leg swelling, presyncope, palpitations, abdominal pain, anorexia, nausea, vomiting, diarrhea  or change in bowel or bladder habits, change in stools or urine, dysuria,hematuria,  rash, arthralgias, visual complaints, headache, numbness, weakness or ataxia or problems with walking or coordination,  change in mood/affect or memory.           Review of Systems     Objective:   Physical Exam    amb elderly wf nad > stated age with harsh upper airway cough on late insp and end exp    Wt Readings from Last 3 Encounters:  07/31/16 140 lb (63.5 kg)  01/18/15 131 lb (59.4 kg)    Vital signs reviewed  - Note on arrival 02 sats  95% on RA     HEENT: nl dentition, turbinates bilaterally, and oropharynx. Nl external ear canals without cough reflex   NECK :  without JVD/Nodes/TM/ nl carotid upstrokes bilaterally   LUNGS: no acc muscle use,  Nl contour chest which is clear to A and P     CV:  RRR  no s3 or murmur or increase in P2, and no edema   ABD:  soft and nontender with nl inspiratory excursion in the supine position. No bruits or organomegaly appreciated, bowel sounds nl  MS:  Nl gait/ ext warm without deformities, calf tenderness, cyanosis or clubbing No obvious joint restrictions   SKIN: warm and dry without lesions    NEURO:  alert,  Unusual affect/ variable recall  with  no motor or cerebellar deficits apparent.    CXR PA and Lateral:   07/31/2016 :    I personally reviewed images and agree with radiology impression as follows:   Normal chest.   Labs ordered 07/31/2016   Allergy profile     Assessment:

## 2016-07-31 NOTE — Patient Instructions (Signed)
Prednisone 10 mg take  4 each am x 2 days,   2 each am x 2 days,  1 each am x 2 days and stop   Pantoprazole (protonix) 40 mg (instead of prilosec 20 x 2)    Take  30-60 min before first meal of the day and Pepcid (famotidine)  20 mg one @  Bedtime (over the counter) until return to office - this is the best way to tell whether stomach acid is contributing to your problem.    GERD (REFLUX)  is an extremely common cause of respiratory symptoms just like yours , many times with no obvious heartburn at all.    It can be treated with medication, but also with lifestyle changes including elevation of the head of your bed (ideally with 6 inch  bed blocks),  Smoking cessation, avoidance of late meals, excessive alcohol, and avoid fatty foods, chocolate, peppermint, colas, red wine, and acidic juices such as orange juice.  NO MINT OR MENTHOL PRODUCTS SO NO COUGH DROPS  USE SUGARLESS CANDY INSTEAD (Jolley ranchers or Stover's or Life Savers) or even ice chips will also do - the key is to swallow to prevent all throat clearing. NO OIL BASED VITAMINS - use powdered substitutes.    Please see patient coordinator before you leave today  to schedule sinus CT  Please remember to go to the lab and x-ray department downstairs in the basement  for your tests - we will call you with the results when they are available.     Please schedule a follow up office visit in 4 weeks, sooner if needed  with all medications /inhalers/ solutions in hand so we can verify exactly what you are taking. This includes all medications from all doctors and over the counters

## 2016-07-31 NOTE — Assessment & Plan Note (Addendum)
The most common causes of chronic cough in immunocompetent adults include the following: upper airway cough syndrome (UACS), previously referred to as postnasal drip syndrome (PNDS), which is caused by variety of rhinosinus conditions; (2) asthma; (3) GERD; (4) chronic bronchitis from cigarette smoking or other inhaled environmental irritants; (5) nonasthmatic eosinophilic bronchitis; and (6) bronchiectasis.   These conditions, singly or in combination, have accounted for up to 94% of the causes of chronic cough in prospective studies.   Other conditions have constituted no >6% of the causes in prospective studies These have included bronchogenic carcinoma, chronic interstitial pneumonia, sarcoidosis, left ventricular failure, ACEI-induced cough, and aspiration from a condition associated with pharyngeal dysfunction.    Chronic cough is often simultaneously caused by more than one condition. A single cause has been found from 38 to 82% of the time, multiple causes from 18 to 62%. Multiply caused cough has been the result of three diseases up to 42% of the time.       Given rhinitis hx this is either cough variant asthma or more likely Upper airway cough syndrome (previously labeled PNDS) , is  so named because it's frequently impossible to sort out how much is  CR/sinusitis with freq throat clearing (which can be related to primary GERD)   vs  causing  secondary (" extra esophageal")  GERD from wide swings in gastric pressure that occur with throat clearing, often  promoting self use of mint and menthol lozenges that reduce the lower esophageal sphincter tone and exacerbate the problem further in a cyclical fashion.   These are the same pts (now being labeled as having "irritable larynx syndrome" by some cough centers) who not infrequently have a history of having failed to tolerate ace inhibitors,  dry powder inhalers or biphosphonates or report having atypical/extraesophageal reflux symptoms that don't  respond to standard doses of PPI  and are easily confused as having aecopd or asthma flares by even experienced allergists/ pulmonologists (myself included).   Of the three most common causes of  Sub-acute or recurrent or chronic cough, only one (GERD)  can actually contribute to/ trigger  the other two (asthma and post nasal drip syndrome)  and perpetuate the cylce of cough.  While not intuitively obvious, many patients with chronic low grade reflux do not cough until there is a primary insult that disturbs the protective epithelial barrier and exposes sensitive nerve endings.   This is typically viral but can be direct physical injury such as with an endotracheal tube.   The point is that once this occurs, it is difficult to eliminate the cycle  using anything but a maximally effective acid suppression regimen at least in the short run, accompanied by an appropriate diet to address non acid GERD and control / eliminate the cough itself for at least 3 days.   rec max rx for reflux while pursuing w/u with sinus ct and allergy profile pending   .Total time devoted to counseling  > 50 % of initial 60 min office visit:  review case with pt/ discussion of options/alternatives/ personally creating written customized instructions  in presence of pt  then going over those specific  Instructions directly with the pt including how to use all of the meds but in particular covering each new medication in detail and the difference between the maintenance= "automatic" meds and the prns using an action plan format for the latter (If this problem/symptom => do that organization reading Left to right).  Please see AVS from  this visit for a full list of these instructions which I personally wrote for this pt and  are unique to this visit.

## 2016-08-01 LAB — RESPIRATORY ALLERGY PROFILE REGION II ~~LOC~~
Allergen, Cedar tree, t12: <0.1 kU/L
Allergen, D pternoyssinus,d7: <0.1 kU/L
Allergen, Mouse Urine Protein, e78: <0.1 kU/L
Allergen, Oak,t7: <0.1 kU/L
Allergen, P. notatum, m1: <0.1 kU/L
Cat Dander: <0.1 kU/L
D. farinae: <0.1 kU/L
Dog Dander: <0.1 kU/L
Elm IgE: <0.1 kU/L
IgE (Immunoglobulin E), Serum: 3 kU/L (ref <115)
Johnson Grass: <0.1 kU/L
Pecan/Hickory Tree IgE: <0.1 kU/L
Sheep Sorrel IgE: <0.1 kU/L

## 2016-08-01 NOTE — Progress Notes (Signed)
Left message for patient to contact office for medical results.

## 2016-08-03 NOTE — Progress Notes (Signed)
Spoke with pt and notified of results per Dr. Wert. Pt verbalized understanding and denied any questions. 

## 2016-08-21 ENCOUNTER — Ambulatory Visit (INDEPENDENT_AMBULATORY_CARE_PROVIDER_SITE_OTHER)
Admission: RE | Admit: 2016-08-21 | Discharge: 2016-08-21 | Disposition: A | Discharge status: Home / Self Care | Payer: Medicare PPO | Source: Ambulatory Visit | Attending: Internal Medicine | Admitting: Internal Medicine

## 2016-08-21 DIAGNOSIS — R05 Cough: Secondary | ICD-10-CM | POA: Diagnosis not present

## 2016-08-21 DIAGNOSIS — R053 Chronic cough: Secondary | ICD-10-CM

## 2016-08-21 NOTE — Progress Notes (Signed)
Spoke with pt and notified of results per Dr. Wert. Pt verbalized understanding and denied any questions. 

## 2016-08-28 ENCOUNTER — Ambulatory Visit: Payer: Medicare PPO | Admitting: Internal Medicine

## 2016-09-06 DIAGNOSIS — E7800 Pure hypercholesterolemia, unspecified: Secondary | ICD-10-CM | POA: Diagnosis not present

## 2016-09-06 DIAGNOSIS — M542 Cervicalgia: Secondary | ICD-10-CM | POA: Diagnosis not present

## 2016-09-06 DIAGNOSIS — K219 Gastro-esophageal reflux disease without esophagitis: Secondary | ICD-10-CM | POA: Diagnosis not present

## 2016-09-06 DIAGNOSIS — Z131 Encounter for screening for diabetes mellitus: Secondary | ICD-10-CM | POA: Diagnosis not present

## 2016-09-06 DIAGNOSIS — Z0000 Encounter for general adult medical examination without abnormal findings: Secondary | ICD-10-CM | POA: Diagnosis not present

## 2016-10-26 ENCOUNTER — Other Ambulatory Visit: Payer: Self-pay | Admitting: Internal Medicine

## 2016-10-26 DIAGNOSIS — R05 Cough: Secondary | ICD-10-CM

## 2016-10-26 DIAGNOSIS — R053 Chronic cough: Secondary | ICD-10-CM

## 2016-12-05 DIAGNOSIS — M438X9 Other specified deforming dorsopathies, site unspecified: Secondary | ICD-10-CM | POA: Diagnosis not present

## 2016-12-05 DIAGNOSIS — M545 Low back pain: Secondary | ICD-10-CM | POA: Diagnosis not present

## 2016-12-05 DIAGNOSIS — M542 Cervicalgia: Secondary | ICD-10-CM | POA: Diagnosis not present

## 2017-06-14 DIAGNOSIS — R21 Rash and other nonspecific skin eruption: Secondary | ICD-10-CM | POA: Diagnosis not present

## 2017-06-18 DIAGNOSIS — R21 Rash and other nonspecific skin eruption: Secondary | ICD-10-CM | POA: Diagnosis not present

## 2017-06-18 DIAGNOSIS — D649 Anemia, unspecified: Secondary | ICD-10-CM | POA: Diagnosis not present

## 2017-07-16 DIAGNOSIS — R21 Rash and other nonspecific skin eruption: Secondary | ICD-10-CM | POA: Diagnosis not present

## 2017-07-16 DIAGNOSIS — R197 Diarrhea, unspecified: Secondary | ICD-10-CM | POA: Diagnosis not present

## 2017-07-16 DIAGNOSIS — R05 Cough: Secondary | ICD-10-CM | POA: Diagnosis not present

## 2017-07-16 DIAGNOSIS — D649 Anemia, unspecified: Secondary | ICD-10-CM | POA: Diagnosis not present

## 2017-07-16 DIAGNOSIS — Z136 Encounter for screening for cardiovascular disorders: Secondary | ICD-10-CM | POA: Diagnosis not present

## 2017-07-16 DIAGNOSIS — Z1159 Encounter for screening for other viral diseases: Secondary | ICD-10-CM | POA: Diagnosis not present

## 2017-08-13 DIAGNOSIS — R197 Diarrhea, unspecified: Secondary | ICD-10-CM | POA: Diagnosis not present

## 2017-08-13 DIAGNOSIS — B351 Tinea unguium: Secondary | ICD-10-CM | POA: Diagnosis not present

## 2017-08-13 DIAGNOSIS — R221 Localized swelling, mass and lump, neck: Secondary | ICD-10-CM | POA: Diagnosis not present

## 2017-10-15 DIAGNOSIS — B351 Tinea unguium: Secondary | ICD-10-CM | POA: Diagnosis not present

## 2017-10-15 DIAGNOSIS — Z87820 Personal history of traumatic brain injury: Secondary | ICD-10-CM | POA: Diagnosis not present

## 2017-11-12 DIAGNOSIS — Z87820 Personal history of traumatic brain injury: Secondary | ICD-10-CM | POA: Diagnosis not present

## 2017-11-26 DIAGNOSIS — Z87820 Personal history of traumatic brain injury: Secondary | ICD-10-CM | POA: Diagnosis not present

## 2018-02-12 DIAGNOSIS — D649 Anemia, unspecified: Secondary | ICD-10-CM | POA: Diagnosis not present

## 2018-02-12 DIAGNOSIS — R197 Diarrhea, unspecified: Secondary | ICD-10-CM | POA: Diagnosis not present

## 2018-02-12 DIAGNOSIS — B351 Tinea unguium: Secondary | ICD-10-CM | POA: Diagnosis not present

## 2018-02-22 DIAGNOSIS — L989 Disorder of the skin and subcutaneous tissue, unspecified: Secondary | ICD-10-CM | POA: Diagnosis not present

## 2018-02-22 DIAGNOSIS — R239 Unspecified skin changes: Secondary | ICD-10-CM | POA: Diagnosis not present

## 2018-02-22 DIAGNOSIS — L905 Scar conditions and fibrosis of skin: Secondary | ICD-10-CM | POA: Diagnosis not present

## 2018-02-23 DIAGNOSIS — Z1211 Encounter for screening for malignant neoplasm of colon: Secondary | ICD-10-CM | POA: Diagnosis not present

## 2018-03-01 DIAGNOSIS — L989 Disorder of the skin and subcutaneous tissue, unspecified: Secondary | ICD-10-CM | POA: Diagnosis not present

## 2018-06-21 DIAGNOSIS — Z87820 Personal history of traumatic brain injury: Secondary | ICD-10-CM | POA: Diagnosis not present

## 2018-08-29 IMAGING — CT CT PARANASAL SINUSES LIMITED
1 of 2 series · 8 of 11 positions shown, 10 images · non-contrast
Comparison: None.

CLINICAL DATA: 65-year-old female with 2-3 year history of
productive cough. Pressure behind eyes. Sinus drainage despite
antibiotics. Sinus surgery 2007. Initial encounter.

EXAM:
CT PARANASAL SINUS LIMITED WITHOUT CONTRAST
TECHNIQUE: Non-contiguous multidetector CT images of the paranasal sinuses were
obtained in a single plane without contrast.

[Series 4: limited sinus st · axial · 0.24mm/px · z∈[+37,+107]mm · 8 of 10 slices shown, 10 images]
[im 2/10  brain]
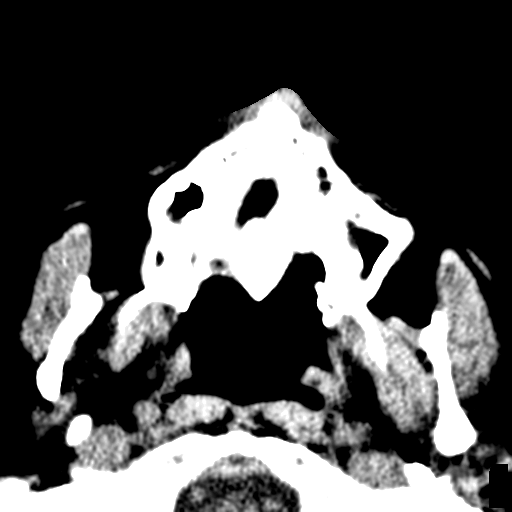
[im 2/10  bone]
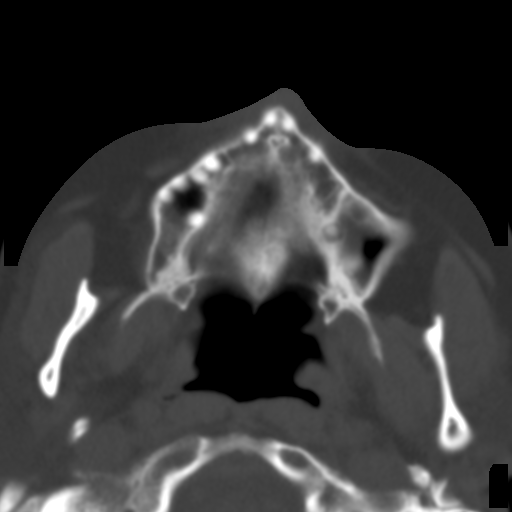
[im 3/10  bone]
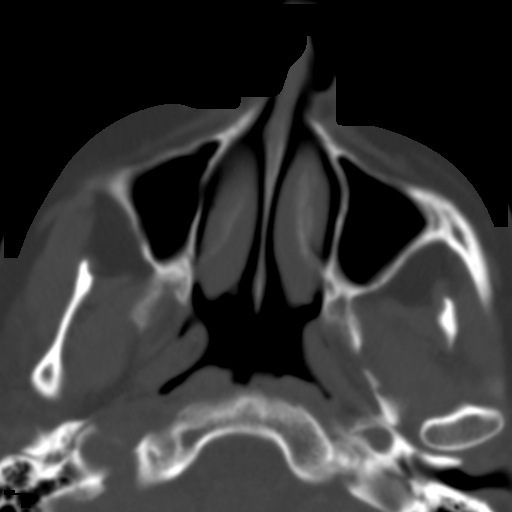
[im 4/10  bone]
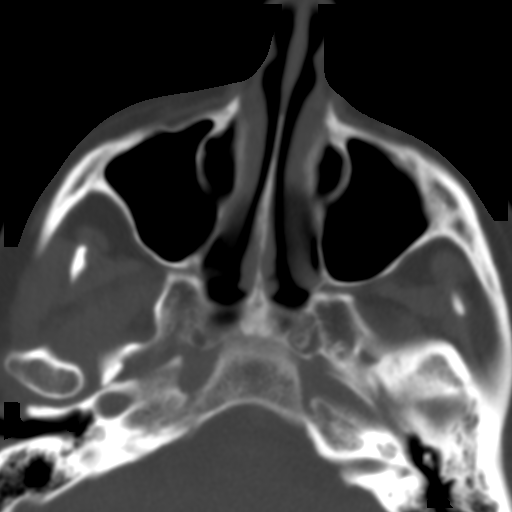
[im 5/10  bone]
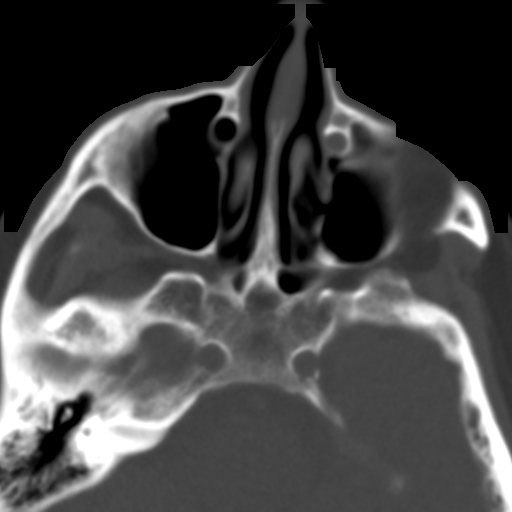
[im 6/10  brain]
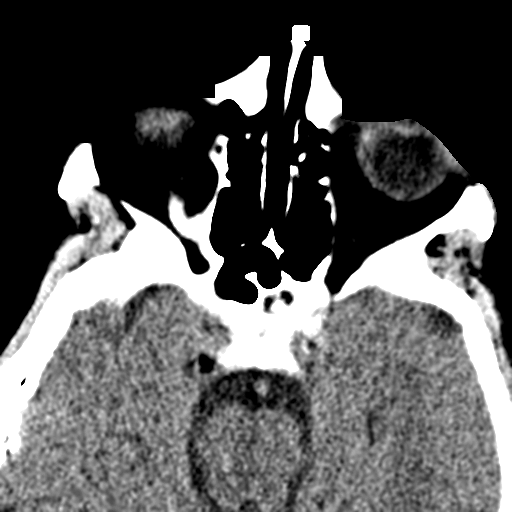
[im 6/10  bone]
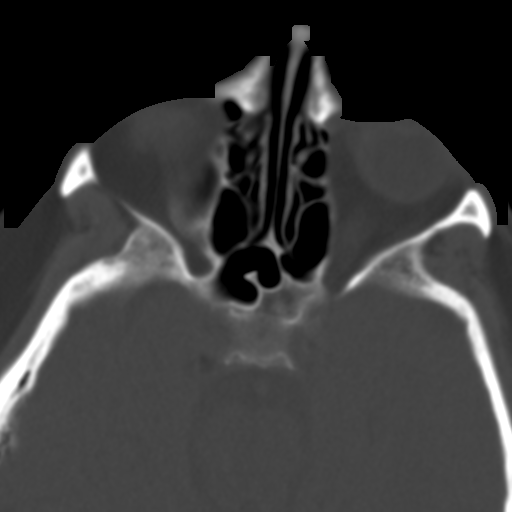
[im 7/10  bone]
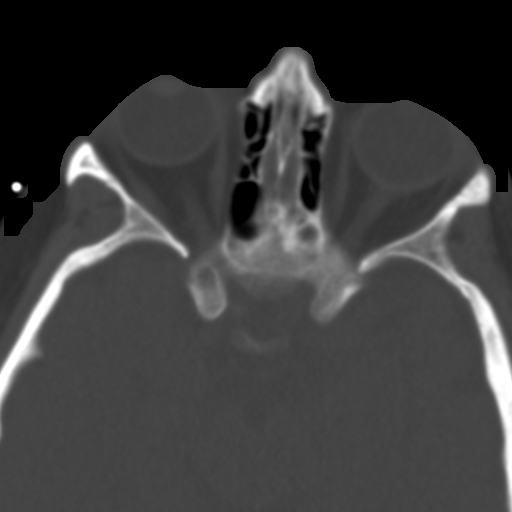
[im 8/10  bone]
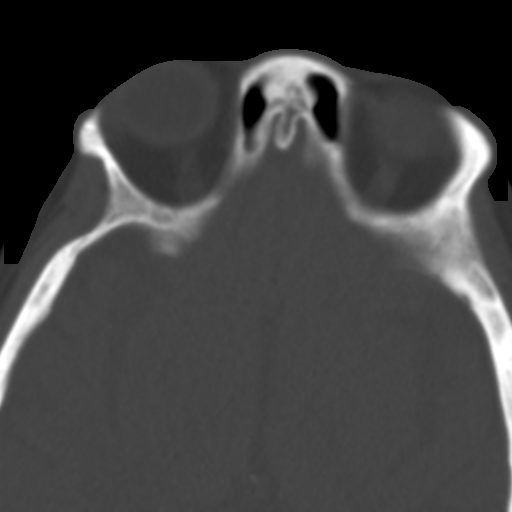
[im 9/10  bone]
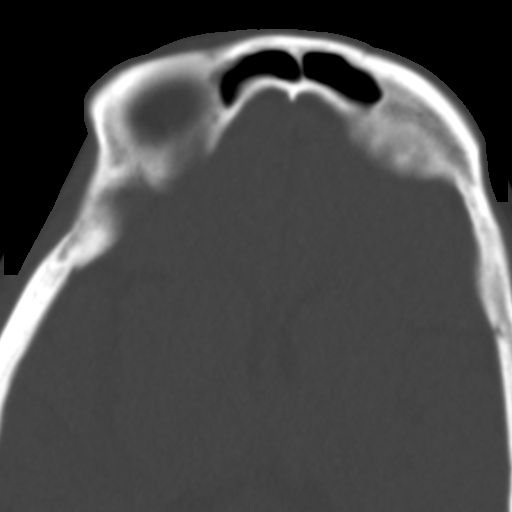

[8 of 11 positions shown; findings below may reference images not displayed]

FINDINGS: Limited sinus CT:

Visualized paranasal sinuses, mastoid air cells and middle ear
cavities are clear.

Intracranial atrophy. Post left lens replacement. Visualized orbital
structures otherwise unremarkable.
IMPRESSION: Visualized paranasal sinuses are clear.

## 2019-04-17 DIAGNOSIS — Z87820 Personal history of traumatic brain injury: Secondary | ICD-10-CM | POA: Diagnosis not present

## 2019-04-17 DIAGNOSIS — G629 Polyneuropathy, unspecified: Secondary | ICD-10-CM | POA: Diagnosis not present

## 2019-04-17 DIAGNOSIS — L989 Disorder of the skin and subcutaneous tissue, unspecified: Secondary | ICD-10-CM | POA: Diagnosis not present

## 2019-04-17 DIAGNOSIS — K219 Gastro-esophageal reflux disease without esophagitis: Secondary | ICD-10-CM | POA: Diagnosis not present

## 2019-04-17 DIAGNOSIS — B351 Tinea unguium: Secondary | ICD-10-CM | POA: Diagnosis not present

## 2020-03-01 DIAGNOSIS — Z87820 Personal history of traumatic brain injury: Secondary | ICD-10-CM | POA: Diagnosis not present

## 2020-03-01 DIAGNOSIS — Z760 Encounter for issue of repeat prescription: Secondary | ICD-10-CM | POA: Diagnosis not present

## 2020-03-01 DIAGNOSIS — D649 Anemia, unspecified: Secondary | ICD-10-CM | POA: Diagnosis not present

## 2020-03-01 DIAGNOSIS — K219 Gastro-esophageal reflux disease without esophagitis: Secondary | ICD-10-CM | POA: Diagnosis not present

## 2020-03-01 DIAGNOSIS — L989 Disorder of the skin and subcutaneous tissue, unspecified: Secondary | ICD-10-CM | POA: Diagnosis not present
